# Patient Record
Sex: Male | Born: 1970 | Race: Black or African American | Hispanic: No | Marital: Married | Smoking: Current every day smoker
Health system: Southern US, Community
[De-identification: ages and names within clinical notes are randomized; demographics above are authoritative.]

## PROBLEM LIST (undated history)

## (undated) DIAGNOSIS — I10 Essential (primary) hypertension: Secondary | ICD-10-CM

## (undated) DIAGNOSIS — G35 Multiple sclerosis: Secondary | ICD-10-CM

## (undated) DIAGNOSIS — E119 Type 2 diabetes mellitus without complications: Secondary | ICD-10-CM

## (undated) DIAGNOSIS — E785 Hyperlipidemia, unspecified: Secondary | ICD-10-CM

---

## 1997-11-21 ENCOUNTER — Emergency Department (HOSPITAL_COMMUNITY): Admission: EM | Admit: 1997-11-21 | Discharge: 1997-11-21 | Payer: Self-pay | Admitting: Emergency Medicine

## 1999-03-08 ENCOUNTER — Emergency Department (HOSPITAL_COMMUNITY): Admission: EM | Admit: 1999-03-08 | Discharge: 1999-03-08 | Payer: Self-pay | Admitting: Emergency Medicine

## 2012-06-06 ENCOUNTER — Encounter (HOSPITAL_BASED_OUTPATIENT_CLINIC_OR_DEPARTMENT_OTHER): Payer: Self-pay

## 2012-06-06 ENCOUNTER — Emergency Department (HOSPITAL_BASED_OUTPATIENT_CLINIC_OR_DEPARTMENT_OTHER)
Admission: EM | Admit: 2012-06-06 | Discharge: 2012-06-06 | Disposition: A | Discharge status: Home / Self Care | Payer: BC Managed Care – PPO | Attending: Emergency Medicine | Admitting: Emergency Medicine

## 2012-06-06 ENCOUNTER — Emergency Department (HOSPITAL_BASED_OUTPATIENT_CLINIC_OR_DEPARTMENT_OTHER): Payer: BC Managed Care – PPO

## 2012-06-06 DIAGNOSIS — Y92410 Unspecified street and highway as the place of occurrence of the external cause: Secondary | ICD-10-CM | POA: Insufficient documentation

## 2012-06-06 DIAGNOSIS — E119 Type 2 diabetes mellitus without complications: Secondary | ICD-10-CM | POA: Insufficient documentation

## 2012-06-06 DIAGNOSIS — E7800 Pure hypercholesterolemia, unspecified: Secondary | ICD-10-CM | POA: Insufficient documentation

## 2012-06-06 DIAGNOSIS — M545 Low back pain, unspecified: Secondary | ICD-10-CM

## 2012-06-06 DIAGNOSIS — R0789 Other chest pain: Secondary | ICD-10-CM

## 2012-06-06 DIAGNOSIS — S0993XA Unspecified injury of face, initial encounter: Secondary | ICD-10-CM | POA: Insufficient documentation

## 2012-06-06 DIAGNOSIS — G35 Multiple sclerosis: Secondary | ICD-10-CM | POA: Insufficient documentation

## 2012-06-06 DIAGNOSIS — IMO0002 Reserved for concepts with insufficient information to code with codable children: Secondary | ICD-10-CM | POA: Insufficient documentation

## 2012-06-06 DIAGNOSIS — F17200 Nicotine dependence, unspecified, uncomplicated: Secondary | ICD-10-CM | POA: Insufficient documentation

## 2012-06-06 DIAGNOSIS — I10 Essential (primary) hypertension: Secondary | ICD-10-CM | POA: Insufficient documentation

## 2012-06-06 DIAGNOSIS — Z79899 Other long term (current) drug therapy: Secondary | ICD-10-CM | POA: Insufficient documentation

## 2012-06-06 DIAGNOSIS — G8929 Other chronic pain: Secondary | ICD-10-CM | POA: Insufficient documentation

## 2012-06-06 DIAGNOSIS — S99929A Unspecified injury of unspecified foot, initial encounter: Secondary | ICD-10-CM | POA: Insufficient documentation

## 2012-06-06 DIAGNOSIS — S298XXA Other specified injuries of thorax, initial encounter: Secondary | ICD-10-CM | POA: Insufficient documentation

## 2012-06-06 DIAGNOSIS — S8990XA Unspecified injury of unspecified lower leg, initial encounter: Secondary | ICD-10-CM | POA: Insufficient documentation

## 2012-06-06 DIAGNOSIS — Y9389 Activity, other specified: Secondary | ICD-10-CM | POA: Insufficient documentation

## 2012-06-06 DIAGNOSIS — S199XXA Unspecified injury of neck, initial encounter: Secondary | ICD-10-CM | POA: Insufficient documentation

## 2012-06-06 DIAGNOSIS — Z8669 Personal history of other diseases of the nervous system and sense organs: Secondary | ICD-10-CM | POA: Insufficient documentation

## 2012-06-06 HISTORY — DX: Multiple sclerosis: G35

## 2012-06-06 HISTORY — DX: Type 2 diabetes mellitus without complications: E11.9

## 2012-06-06 HISTORY — DX: Hyperlipidemia, unspecified: E78.5

## 2012-06-06 HISTORY — DX: Essential (primary) hypertension: I10

## 2012-06-06 MED ORDER — IBUPROFEN 800 MG PO TABS
800.0000 mg | ORAL_TABLET | Freq: Once | ORAL | Status: DC
  Filled 2012-06-06: qty 1

## 2012-06-06 MED ORDER — OXYCODONE-ACETAMINOPHEN 5-325 MG PO TABS
1.0000 | ORAL_TABLET | Freq: Once | ORAL | Status: DC
  Filled 2012-06-06 (×2): qty 1
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
## 2012-06-06 NOTE — ED Notes (Signed)
MD at bedside. 

## 2017-12-09 ENCOUNTER — Encounter (HOSPITAL_COMMUNITY): Payer: Self-pay | Admitting: Emergency Medicine

## 2017-12-09 ENCOUNTER — Emergency Department (HOSPITAL_COMMUNITY)
Admission: EM | Admit: 2017-12-09 | Discharge: 2017-12-10 | Disposition: A | Discharge status: Home / Self Care | Payer: Medicare Other | Attending: Emergency Medicine | Admitting: Emergency Medicine

## 2017-12-09 ENCOUNTER — Emergency Department (HOSPITAL_COMMUNITY): Payer: Medicare Other

## 2017-12-09 DIAGNOSIS — I129 Hypertensive chronic kidney disease with stage 1 through stage 4 chronic kidney disease, or unspecified chronic kidney disease: Secondary | ICD-10-CM | POA: Insufficient documentation

## 2017-12-09 DIAGNOSIS — M25562 Pain in left knee: Secondary | ICD-10-CM | POA: Diagnosis present

## 2017-12-09 DIAGNOSIS — Z79899 Other long term (current) drug therapy: Secondary | ICD-10-CM | POA: Diagnosis not present

## 2017-12-09 DIAGNOSIS — R9431 Abnormal electrocardiogram [ECG] [EKG]: Secondary | ICD-10-CM | POA: Diagnosis not present

## 2017-12-09 DIAGNOSIS — R109 Unspecified abdominal pain: Secondary | ICD-10-CM | POA: Diagnosis not present

## 2017-12-09 DIAGNOSIS — Z7984 Long term (current) use of oral hypoglycemic drugs: Secondary | ICD-10-CM | POA: Insufficient documentation

## 2017-12-09 DIAGNOSIS — M542 Cervicalgia: Secondary | ICD-10-CM | POA: Diagnosis not present

## 2017-12-09 DIAGNOSIS — R079 Chest pain, unspecified: Secondary | ICD-10-CM | POA: Insufficient documentation

## 2017-12-09 DIAGNOSIS — E119 Type 2 diabetes mellitus without complications: Secondary | ICD-10-CM | POA: Diagnosis not present

## 2017-12-09 DIAGNOSIS — N189 Chronic kidney disease, unspecified: Secondary | ICD-10-CM

## 2017-12-09 DIAGNOSIS — F17210 Nicotine dependence, cigarettes, uncomplicated: Secondary | ICD-10-CM | POA: Diagnosis not present

## 2017-12-09 DIAGNOSIS — M25561 Pain in right knee: Secondary | ICD-10-CM | POA: Insufficient documentation

## 2017-12-09 DIAGNOSIS — R51 Headache: Secondary | ICD-10-CM | POA: Diagnosis not present

## 2017-12-09 LAB — I-STAT TROPONIN, ED
Troponin i, poc: 0 ng/mL (ref 0.00–0.08)
Troponin i, poc: 0.02 ng/mL (ref 0.00–0.08)

## 2017-12-09 LAB — CBC WITH DIFFERENTIAL/PLATELET
BASOS PCT: 1 %
Basophils Absolute: 0.1 10*3/uL (ref 0.0–0.1)
Eosinophils Absolute: 0.4 10*3/uL (ref 0.0–0.7)
Eosinophils Relative: 3 %
HEMATOCRIT: 39.5 % (ref 39.0–52.0)
HEMOGLOBIN: 13.4 g/dL (ref 13.0–17.0)
LYMPHS ABS: 3.4 10*3/uL (ref 0.7–4.0)
Lymphocytes Relative: 27 %
MCH: 30.3 pg (ref 26.0–34.0)
MCHC: 33.9 g/dL (ref 30.0–36.0)
MCV: 89.4 fL (ref 78.0–100.0)
MONO ABS: 0.8 10*3/uL (ref 0.1–1.0)
MONOS PCT: 6 %
NEUTROS ABS: 8 10*3/uL — AB (ref 1.7–7.7)
NEUTROS PCT: 63 %
Platelets: 243 10*3/uL (ref 150–400)
RBC: 4.42 MIL/uL (ref 4.22–5.81)
RDW: 15.4 % (ref 11.5–15.5)
WBC: 12.7 10*3/uL — ABNORMAL HIGH (ref 4.0–10.5)

## 2017-12-09 LAB — COMPREHENSIVE METABOLIC PANEL
ALK PHOS: 54 U/L (ref 38–126)
ALT: 23 U/L (ref 0–44)
ANION GAP: 11 (ref 5–15)
AST: 25 U/L (ref 15–41)
Albumin: 4.2 g/dL (ref 3.5–5.0)
BUN: 19 mg/dL (ref 6–20)
CALCIUM: 9.7 mg/dL (ref 8.9–10.3)
CHLORIDE: 108 mmol/L (ref 98–111)
CO2: 25 mmol/L (ref 22–32)
Creatinine, Ser: 1.93 mg/dL — ABNORMAL HIGH (ref 0.61–1.24)
GFR calc non Af Amer: 39 mL/min — ABNORMAL LOW (ref >60)
GFR, EST AFRICAN AMERICAN: 45 mL/min — AB (ref >60)
Glucose, Bld: 107 mg/dL — ABNORMAL HIGH (ref 70–99)
Potassium: 3.9 mmol/L (ref 3.5–5.1)
SODIUM: 144 mmol/L (ref 135–145)
Total Bilirubin: 0.7 mg/dL (ref 0.3–1.2)
Total Protein: 7.5 g/dL (ref 6.5–8.1)

## 2017-12-09 MED ORDER — BACLOFEN 10 MG PO TABS
20.0000 mg | ORAL_TABLET | Freq: Once | ORAL | Status: AC
  Administered 2017-12-09: 20 mg via ORAL
  Filled 2017-12-09: qty 2
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
## 2017-12-09 NOTE — ED Notes (Signed)
Pt transported to CT ?
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
## 2017-12-10 ENCOUNTER — Encounter (HOSPITAL_COMMUNITY): Payer: Self-pay | Admitting: Emergency Medicine

## 2017-12-10 NOTE — Discharge Instructions (Addendum)
Please take Tylenol (acetaminophen) to relieve your pain.  You may take tylenol, up to 1,000 mg (two extra strength pills).  Do not take more than 3,000 mg tylenol in a 24 hour period.  Please check all medication labels as many medications such as pain and cold medications may contain tylenol. Please do not drink alcohol while taking this medication.   Today you received medications that may make you sleepy or impair your ability to make decisions.  For the next 24 hours please do not drive, operate heavy machinery, care for a small child with out another adult present, or perform any activities that may cause harm to you or someone else if you were to fall asleep or be impaired.   I have given you some food guidelines that help keep your kidneys healthy.
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
